# Patient Record
Sex: Male | Born: 1990 | Marital: Married | Smoking: Former smoker
Health system: Southern US, Community
[De-identification: ages and names within clinical notes are randomized; demographics above are authoritative.]

## PROBLEM LIST (undated history)

## (undated) DIAGNOSIS — R51 Headache: Secondary | ICD-10-CM

## (undated) DIAGNOSIS — G43909 Migraine, unspecified, not intractable, without status migrainosus: Secondary | ICD-10-CM

## (undated) DIAGNOSIS — R519 Headache, unspecified: Secondary | ICD-10-CM

## (undated) DIAGNOSIS — R011 Cardiac murmur, unspecified: Secondary | ICD-10-CM

## (undated) DIAGNOSIS — I10 Essential (primary) hypertension: Secondary | ICD-10-CM

## (undated) HISTORY — DX: Migraine, unspecified, not intractable, without status migrainosus: G43.909

## (undated) HISTORY — PX: HIP SURGERY: SHX245

## (undated) HISTORY — DX: Headache: R51

## (undated) HISTORY — DX: Headache, unspecified: R51.9

## (undated) HISTORY — DX: Cardiac murmur, unspecified: R01.1

## (undated) HISTORY — DX: Essential (primary) hypertension: I10

---

## 2018-05-12 ENCOUNTER — Ambulatory Visit (INDEPENDENT_AMBULATORY_CARE_PROVIDER_SITE_OTHER): Payer: Managed Care, Other (non HMO) | Admitting: Family Medicine

## 2018-05-12 ENCOUNTER — Encounter: Payer: Self-pay | Admitting: Family Medicine

## 2018-05-12 ENCOUNTER — Ambulatory Visit (INDEPENDENT_AMBULATORY_CARE_PROVIDER_SITE_OTHER): Payer: Managed Care, Other (non HMO)

## 2018-05-12 VITALS — BP 138/90 | HR 92 | Temp 97.2°F | Ht 71.0 in | Wt 232.0 lb

## 2018-05-12 DIAGNOSIS — R5383 Other fatigue: Secondary | ICD-10-CM

## 2018-05-12 DIAGNOSIS — M25551 Pain in right hip: Secondary | ICD-10-CM

## 2018-05-12 DIAGNOSIS — M25552 Pain in left hip: Secondary | ICD-10-CM | POA: Diagnosis not present

## 2018-05-12 DIAGNOSIS — F17210 Nicotine dependence, cigarettes, uncomplicated: Secondary | ICD-10-CM

## 2018-05-12 DIAGNOSIS — G8929 Other chronic pain: Secondary | ICD-10-CM | POA: Diagnosis not present

## 2018-05-12 DIAGNOSIS — M5441 Lumbago with sciatica, right side: Secondary | ICD-10-CM | POA: Diagnosis not present

## 2018-05-12 DIAGNOSIS — Z23 Encounter for immunization: Secondary | ICD-10-CM

## 2018-05-12 DIAGNOSIS — M93003 Unspecified slipped upper femoral epiphysis (nontraumatic), unspecified hip: Secondary | ICD-10-CM

## 2018-05-12 MED ORDER — GABAPENTIN 100 MG PO CAPS: 100.0000 mg | ORAL_CAPSULE | Freq: Two times a day (BID) | ORAL | 2 refills | Status: DC

## 2018-05-12 NOTE — Progress Notes (Signed)
Subjective:    Patient ID: Colin Calhoun, male    DOB: Aug 20, 1990, 27 y.o.   MRN: [id number]  HPI   Patient presents to clinic to establish care with new PCP.  Past medical history, social history, surgical history and family history reviewed and updated in chart.  Patient's main concern today is low back pain and bilateral hip pain.  Patient states he has struggled with back pain for the past 7 to 8 years.  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  States he was told by orthopedics that at age 44 he may have to have another surgery, especially on his right hip, but also was told this was not a requirement.  Patient was not having any issues at the time so he decided not to follow back up with orthopedics.  As he has gotten older, low back pain and hip pains have increased.  He does not do any heavy lifting for work, although lifting he does now is picking up his 2 young children.  States he does notice some pain/pressure relief when he lays flat on back and raise his leg straight up in the air, but when he raises right leg up in the air it is not fully go up straight it will hang out towards the right.  He smokes 1/2 PPD x12 years.    Patient Active Problem List   Diagnosis Date Noted  . Chronic bilateral low back pain with right-sided sciatica 05/12/2018  . Cigarette smoker 05/12/2018  . Fatigue 05/12/2018  . Pain of both hip joints 05/12/2018   Past Medical History:  Diagnosis Date  . Frequent headaches   . Heart murmur   . Hypertension   . Migraines    Social History   Tobacco Use  . Smoking status: Current Every Day Smoker    Packs/day: 0.50    Years: 12.00    Pack years: 6.00  . Smokeless tobacco: Never Used  Substance Use Topics  . Alcohol use: Yes    Frequency: Never    Comment: special occasion  2 drinks a year   Past Surgical History:  Procedure Laterality Date  . HIP SURGERY Bilateral    Age 76   Review of Systems  Constitutional: +fatigue. Negative for chills, and fever.  HENT: Negative for congestion, ear pain, sinus pain and sore throat.   Eyes: Negative.   Respiratory: Negative for cough, shortness of breath and wheezing.   Cardiovascular: Negative for chest pain, palpitations and leg swelling.  Gastrointestinal: Negative for abdominal pain, diarrhea, nausea and vomiting.  Genitourinary: Negative for dysuria, frequency and urgency.  Musculoskeletal: low back pain, bilat hip pain Skin: Negative for color change, pallor and rash.  Neurological: Negative for syncope, light-headedness and headaches.  Psychiatric/Behavioral: The patient is not nervous/anxious.       Objective:   Physical Exam  Constitutional: He is oriented to person, place, and time. He appears well-developed. No distress.  HENT:  Head: Normocephalic and atraumatic.  Right Ear: External ear normal.  Left Ear: External ear normal.  Mouth/Throat: Oropharynx is clear and moist.  Eyes: Pupils are equal, round, and reactive to light. EOM are normal. Right eye exhibits no discharge. No scleral icterus.  Neck: Normal range of motion. Neck supple. No tracheal deviation present.  Cardiovascular: Normal rate and regular rhythm.  Pulmonary/Chest: Effort  normal and breath sounds normal. No respiratory distress.  Musculoskeletal: He exhibits tenderness. He exhibits no edema.  Tenderness along lumbar spine and bilateral paraspinal pain in bilateral hips, right more than left.  Pain in the right hip and low back when right leg brace.  Patient able to tolerate left leg raise without much pain. Quadricep strength normal.  Light touch sensation in lower extremities normal.  Neurological: He is alert and oriented to person, place, and time.  Skin: Skin is warm and dry. He is not diaphoretic. No pallor.  Psychiatric: He  has a normal mood and affect. His behavior is normal.  Nursing note and vitals reviewed.  Vitals:   05/12/18 1000  BP: 138/90  Pulse: 92  Temp: (!) 97.2 F (36.2 C)  SpO2: 97%      Assessment & Plan:    Low back pain-we will get x-ray of lumbar spine to further evaluate.  Patient will start low-dose gabapentin 100 mg twice daily to see if this helps calm pain.  Also advised he can take ibuprofen as needed for pain control.  Bilateral hip pain-we will get x-rays of both hips.  Patient advised I suspect that issues with his hips could be causing and or related to his low back pain.  Cigarette smoker- patient smokes half a pack per day, not ready to quit at this time.  Patient made aware that smoking not only affects vessels, organs, but it also affects bone strength.  Fatigue -- lab work ordered to further eval  Flu vaccine given  Pending results of x-rays and lab work will determine next step in plan of care.  Most likely will do orthopedic referral.   Patient will follow back up here in 3 months for recheck on how gabapentin is helping to control pain. He is aware he can return to clinic sooner if issues arise.

## 2018-05-13 NOTE — Progress Notes (Unsigned)
Orthpedic surgery referral placed.

## 2018-05-15 ENCOUNTER — Other Ambulatory Visit: Payer: Managed Care, Other (non HMO)

## 2018-06-02 ENCOUNTER — Emergency Department: Payer: Managed Care, Other (non HMO)

## 2018-06-02 ENCOUNTER — Emergency Department
Admission: EM | Admit: 2018-06-02 | Discharge: 2018-06-02 | Disposition: A | Discharge status: Home / Self Care | Payer: Managed Care, Other (non HMO) | Attending: Emergency Medicine | Admitting: Emergency Medicine

## 2018-06-02 ENCOUNTER — Encounter: Payer: Self-pay | Admitting: Emergency Medicine

## 2018-06-02 DIAGNOSIS — R1084 Generalized abdominal pain: Secondary | ICD-10-CM | POA: Diagnosis present

## 2018-06-02 DIAGNOSIS — I10 Essential (primary) hypertension: Secondary | ICD-10-CM | POA: Insufficient documentation

## 2018-06-02 DIAGNOSIS — F17200 Nicotine dependence, unspecified, uncomplicated: Secondary | ICD-10-CM | POA: Diagnosis not present

## 2018-06-02 DIAGNOSIS — Z79899 Other long term (current) drug therapy: Secondary | ICD-10-CM | POA: Insufficient documentation

## 2018-06-02 DIAGNOSIS — R52 Pain, unspecified: Secondary | ICD-10-CM

## 2018-06-02 LAB — HEPATIC FUNCTION PANEL
ALBUMIN: 4.6 g/dL (ref 3.5–5.0)
ALT: 35 U/L (ref 0–44)
AST: 26 U/L (ref 15–41)
Alkaline Phosphatase: 49 U/L (ref 38–126)
Bilirubin, Direct: <0.1 mg/dL (ref 0.0–0.2)
TOTAL PROTEIN: 7.9 g/dL (ref 6.5–8.1)
Total Bilirubin: 0.7 mg/dL (ref 0.3–1.2)

## 2018-06-02 LAB — CBC
HEMATOCRIT: 48.9 % (ref 39.0–52.0)
Hemoglobin: 17 g/dL (ref 13.0–17.0)
MCH: 31.8 pg (ref 26.0–34.0)
MCHC: 34.8 g/dL (ref 30.0–36.0)
MCV: 91.4 fL (ref 80.0–100.0)
NRBC: 0 % (ref 0.0–0.2)
PLATELETS: 365 10*3/uL (ref 150–400)
RBC: 5.35 MIL/uL (ref 4.22–5.81)
RDW: 11.9 % (ref 11.5–15.5)
WBC: 8 10*3/uL (ref 4.0–10.5)

## 2018-06-02 LAB — BASIC METABOLIC PANEL
Anion gap: 8 (ref 5–15)
BUN: 8 mg/dL (ref 6–20)
CALCIUM: 9.3 mg/dL (ref 8.9–10.3)
CO2: 25 mmol/L (ref 22–32)
CREATININE: 0.86 mg/dL (ref 0.61–1.24)
Chloride: 104 mmol/L (ref 98–111)
GFR calc non Af Amer: >60 mL/min (ref >60)
Glucose, Bld: 114 mg/dL — ABNORMAL HIGH (ref 70–99)
Potassium: 3.6 mmol/L (ref 3.5–5.1)
SODIUM: 137 mmol/L (ref 135–145)

## 2018-06-02 LAB — TROPONIN I: Troponin I: <0.03 ng/mL (ref <0.03)

## 2018-06-02 NOTE — ED Triage Notes (Signed)
Pt reports sharp pain to upper left chest that radiates into his abd since last pm. Pt states pain is intermittent. Pt denies SOB, dizziness or other sx's.

## 2018-06-02 NOTE — ED Notes (Signed)
Pt ambulatory to POV without difficulty. VSS. NAD. Discharge instructions, RX and follow up reviewed. All questions and concerns addressed.  

## 2018-06-02 NOTE — ED Triage Notes (Signed)
Pt concerned about appendicitis because what he has read online points to the possibility of appendicitis.

## 2018-06-02 NOTE — ED Notes (Signed)
EDP at bedside at this time.  

## 2018-06-02 NOTE — Discharge Instructions (Addendum)
Please return for worse pain, fever, vomiting or if the pain continues in 2 or 3 days or see your doctor.

## 2018-06-02 NOTE — ED Provider Notes (Signed)
[facility] Emergency Department Provider Note   ____________________________________________   First MD Initiated Contact with Patient 06/02/18 1314     (approximate)  I have reviewed the triage vital signs and the nursing notes.   HISTORY  Chief Complaint Chest Pain   HPI Calix Heinbaugh is a 27 y.o. male patient reports to me he has had pain in the left upper abdomen and lower chest that then radiates down toward his umbilicus and his whole time he then cramps.  Is been going on for 2 or 3 days and this morning it was fairly severe and constant.  Since then it is improved he is only had a 3 times since she is been waiting here for 3-3/4 hours.  He is not short of breath he does not have a cough is no pain with deep breathing he has no other complaints.  He does not have any nausea vomiting or diarrhea.  Nothing seems to bring the pain on or make it worse.  The pain is sharp at times cramping other times.  3 episodes he has had here in the emergency room were brief.  Lasting just a minute or so.   Past Medical History:  Diagnosis Date  . Frequent headaches   . Heart murmur   . Hypertension   . Migraines     Patient Active Problem List   Diagnosis Date Noted  . Chronic bilateral low back pain with right-sided sciatica 05/12/2018  . Cigarette smoker 05/12/2018  . Fatigue 05/12/2018  . Pain of both hip joints 05/12/2018    Past Surgical History:  Procedure Laterality Date  . HIP SURGERY Bilateral    Age 83    Prior to Admission medications   Medication Sig Start Date End Date Taking? Authorizing Provider  gabapentin (NEURONTIN) 100 MG capsule Take 1 capsule (100 mg total) by mouth 2 (two) times daily. 05/12/18   [name], FNP    Allergies Patient has no allergy information on record.  Family History  Problem Relation Age of Onset  . Diabetes Father   . Glaucoma Father   . Autism Sister   . Cancer Maternal Grandmother   . Depression  Paternal Grandfather     Social History Social History   Tobacco Use  . Smoking status: Current Every Day Smoker    Packs/day: 0.50    Years: 12.00    Pack years: 6.00  . Smokeless tobacco: Never Used  Substance Use Topics  . Alcohol use: Yes    Frequency: Never    Comment: special occasion 2 drinks a year  . Drug use: Never    Review of Systems  Constitutional: No fever/chills Eyes: No visual changes. ENT: No sore throat. Cardiovascular: Denies chest pain. Respiratory: Denies shortness of breath. Gastrointestinal:  abdominal pain.  No nausea, no vomiting.  No diarrhea.  No constipation. Genitourinary: Negative for dysuria. Musculoskeletal: Negative for back pain. Skin: Negative for rash. Neurological: Negative for headaches, focal weakness   ____________________________________________   PHYSICAL EXAM:  VITAL SIGNS: ED Triage Vitals  Enc Vitals Group     BP 06/02/18 0940 (!) 162/100     Pulse Rate 06/02/18 0940 81     Resp 06/02/18 0940 20     Temp 06/02/18 0940 97.9 F (36.6 C)     Temp Source 06/02/18 0940 Oral     SpO2 06/02/18 0940 100 %     Weight 06/02/18 0938 220 lb (99.8 kg)     Height  06/02/18 0938 5\' 10"  (1.778 m)     Head Circumference --      Peak Flow --      Pain Score 06/02/18 0938 8     Pain Loc --      Pain Edu? --      Excl. in GC? --     Constitutional: Alert and oriented. Well appearing and in no acute distress. Eyes: Conjunctivae are normal Head: Atraumatic. Nose: No congestion/rhinnorhea. Mouth/Throat: Mucous membranes are moist.  Oropharynx non-erythematous. Neck: No stridor.   Cardiovascular: Normal rate, regular rhythm. Grossly normal heart sounds.  Good peripheral circulation. Respiratory: Normal respiratory effort.  No retractions. Lungs CTAB. Gastrointestinal: Soft and nontender. No distention. No abdominal bruits. No CVA tenderness. Musculoskeletal: No lower extremity tenderness  Neurologic:  Normal speech and language.  No gross focal neurologic deficits are appreciated.  Skin:  Skin is warm, dry and intact. No rash noted. Psychiatric: Mood and affect are normal. Speech and behavior are normal.  ____________________________________________   LABS (all labs ordered are listed, but only abnormal results are displayed)  Labs Reviewed  BASIC METABOLIC PANEL - Abnormal; Notable for the following components:      Result Value   Glucose, Bld 114 (*)    All other components within normal limits  CBC  TROPONIN I  HEPATIC FUNCTION PANEL   ____________________________________________  EKG  EKG read interpreted by me shows normal sinus rhythm at a rate of 81 normal axis no acute ST-T wave changes ____________________________________________  RADIOLOGY  ED MD interpretation: Chest x-ray read by radiology reviewed by me does show a triangular density at the right base could be scarring.  Official radiology report(s): Dg Chest 2 View  Result Date: 06/02/2018 CLINICAL DATA:  Left chest pain.  Smoker, hypertension EXAM: CHEST - 2 VIEW COMPARISON:  None. FINDINGS: Heart is normal size. Increased density noted at the right lung base, favor scarring. No effusions. Left lung is clear. Mild peribronchial thickening. No acute bony abnormality. IMPRESSION: Triangular density noted at the right lung base, favor scarring. Mild peribronchial thickening, favor related to smoking/chronic bronchitis. Electronically Signed   By: Charlett Nose M.D.   On: 06/02/2018 10:07   Dg Abd 2 Views  Result Date: 06/02/2018 CLINICAL DATA:  Left upper quadrant pain for 2 days EXAM: ABDOMEN - 2 VIEW COMPARISON:  05/12/2018 FINDINGS: Scattered large and small bowel gas is noted. No free air is seen. Prominent Riedel's lobe of the liver is noted. No abnormal mass or abnormal calcifications are seen. Postsurgical changes in the hips are noted bilaterally. No other focal abnormality is noted. IMPRESSION: No acute abnormality noted.  Electronically Signed   By: Alcide Clever M.D.   On: 06/02/2018 13:54    ____________________________________________   PROCEDURES  Procedure(s) performed:   Procedures  Critical Care performed:   ____________________________________________   INITIAL IMPRESSION / ASSESSMENT AND PLAN / ED COURSE Patient's pain is better.  Lab work and x-rays are good I will let him go home he can return if worse follow-up with his doctor          ____________________________________________   FINAL CLINICAL IMPRESSION(S) / ED DIAGNOSES  Final diagnoses:  Pain  Generalized abdominal cramps     ED Discharge Orders    None       Note:  This document was prepared using Dragon voice recognition software and may include unintentional dictation errors.    Arnaldo Natal, MD 06/02/18 [phone]

## 2018-06-02 NOTE — ED Notes (Signed)
Pt c/o LUQ abd pain that radiates into RLQ. Pt states pain intermittent since last night. Pt denies N/V/D, denies urinary symptoms, denies CP or SOB. Pt states he is concerned about appendicitis due to google and his mother. PT A&O x4. NAD noted at this time, this RN isntructed patient to stop drinking Pepsi until evaluated by MD.

## 2018-08-18 ENCOUNTER — Ambulatory Visit: Payer: Managed Care, Other (non HMO) | Admitting: Family Medicine

## 2018-08-18 DIAGNOSIS — Z0289 Encounter for other administrative examinations: Secondary | ICD-10-CM

## 2018-08-18 NOTE — Progress Notes (Deleted)
   Subjective:    Patient ID: Colin RobsonWilliam Calhoun, male    DOB: 11/30/1990, 27 y.o.   MRN: [id number]  HPI  Presents to clinic for follow up on back pain  Patient Active Problem List   Diagnosis Date Noted  . Chronic bilateral low back pain with right-sided sciatica 05/12/2018  . Cigarette smoker 05/12/2018  . Fatigue 05/12/2018  . Pain of both hip joints 05/12/2018   Social History   Tobacco Use  . Smoking status: Current Every Day Smoker    Packs/day: 0.50    Years: 12.00    Pack years: 6.00  . Smokeless tobacco: Never Used  Substance Use Topics  . Alcohol use: Yes    Frequency: Never    Comment: special occasion 2 drinks a year   Review of Systems   Constitutional: Negative for chills, fatigue and fever.  HENT: Negative for congestion, ear pain, sinus pain and sore throat.   Eyes: Negative.   Respiratory: Negative for cough, shortness of breath and wheezing.   Cardiovascular: Negative for chest pain, palpitations and leg swelling.  Gastrointestinal: Negative for abdominal pain, diarrhea, nausea and vomiting.  Genitourinary: Negative for dysuria, frequency and urgency.  Musculoskeletal: Negative for arthralgias and myalgias.  Skin: Negative for color change, pallor and rash.  Neurological: Negative for syncope, light-headedness and headaches.  Psychiatric/Behavioral: The patient is not nervous/anxious.       Objective:   Physical Exam        Assessment & Plan:

## 2018-09-01 MED ORDER — LACTATED RINGERS IV SOLN
INTRAVENOUS | Status: DC
Start: ? — End: 2018-09-01

## 2018-09-01 MED ORDER — BISACODYL 10 MG RE SUPP
10.00 | RECTAL | Status: DC
Start: ? — End: 2018-09-01

## 2018-09-01 MED ORDER — SENNOSIDES-DOCUSATE SODIUM 8.6-50 MG PO TABS
1.00 | ORAL_TABLET | ORAL | Status: DC
Start: 2018-09-01 — End: 2018-09-01

## 2018-09-01 MED ORDER — HYDROMORPHONE HCL 1 MG/ML IJ SOLN
.50 | INTRAMUSCULAR | Status: DC
Start: ? — End: 2018-09-01

## 2018-09-01 MED ORDER — POLYETHYLENE GLYCOL 3350 17 G PO PACK
17.00 | PACK | ORAL | Status: DC
Start: 2018-09-02 — End: 2018-09-01

## 2018-09-01 MED ORDER — ACETAMINOPHEN 325 MG PO TABS
975.00 | ORAL_TABLET | ORAL | Status: DC
Start: 2018-09-01 — End: 2018-09-01

## 2018-09-01 MED ORDER — ASPIRIN 325 MG PO TABS
325.00 | ORAL_TABLET | ORAL | Status: DC
Start: 2018-09-01 — End: 2018-09-01

## 2018-09-01 MED ORDER — OXYCODONE HCL 5 MG PO TABS
5.00 | ORAL_TABLET | ORAL | Status: DC
Start: ? — End: 2018-09-01

## 2019-03-05 MED ORDER — BISACODYL 10 MG RE SUPP
10.00 | RECTAL | Status: DC
Start: ? — End: 2019-03-05

## 2019-03-05 MED ORDER — POLYETHYLENE GLYCOL 3350 17 G PO PACK
17.00 | PACK | ORAL | Status: DC
Start: 2019-03-06 — End: 2019-03-05

## 2019-03-05 MED ORDER — LACTATED RINGERS IV SOLN
INTRAVENOUS | Status: DC
Start: ? — End: 2019-03-05

## 2019-03-05 MED ORDER — KETOROLAC TROMETHAMINE 30 MG/ML IJ SOLN
30.00 | INTRAMUSCULAR | Status: DC
Start: ? — End: 2019-03-05

## 2019-03-05 MED ORDER — SENNOSIDES-DOCUSATE SODIUM 8.6-50 MG PO TABS
1.00 | ORAL_TABLET | ORAL | Status: DC
Start: 2019-03-05 — End: 2019-03-05

## 2019-03-05 MED ORDER — HYDROMORPHONE HCL 1 MG/ML IJ SOLN
0.50 | INTRAMUSCULAR | Status: DC
Start: ? — End: 2019-03-05

## 2019-03-05 MED ORDER — ASPIRIN EC 325 MG PO TBEC
325.00 | DELAYED_RELEASE_TABLET | ORAL | Status: DC
Start: 2019-03-05 — End: 2019-03-05

## 2019-03-05 MED ORDER — ACETAMINOPHEN 325 MG PO TABS
650.00 | ORAL_TABLET | ORAL | Status: DC
Start: ? — End: 2019-03-05

## 2019-03-05 MED ORDER — SODIUM CHLORIDE 0.9 % IV SOLN
INTRAVENOUS | Status: DC
Start: ? — End: 2019-03-05

## 2019-03-05 MED ORDER — OXYCODONE HCL 5 MG PO TABS
5.00 | ORAL_TABLET | ORAL | Status: DC
Start: ? — End: 2019-03-05

## 2019-03-05 MED ORDER — GENERIC EXTERNAL MEDICATION
Status: DC
Start: ? — End: 2019-03-05

## 2020-04-03 ENCOUNTER — Other Ambulatory Visit: Payer: Self-pay

## 2020-04-03 ENCOUNTER — Ambulatory Visit
Admission: RE | Admit: 2020-04-03 | Discharge: 2020-04-03 | Disposition: A | Discharge status: Home / Self Care | Payer: Managed Care, Other (non HMO) | Source: Ambulatory Visit | Attending: Emergency Medicine | Admitting: Emergency Medicine

## 2020-04-03 ENCOUNTER — Telehealth: Payer: Self-pay | Admitting: Family Medicine

## 2020-04-03 VITALS — BP 138/84 | HR 63 | Temp 98.2°F | Resp 17

## 2020-04-03 DIAGNOSIS — J069 Acute upper respiratory infection, unspecified: Secondary | ICD-10-CM | POA: Diagnosis not present

## 2020-04-03 NOTE — Discharge Instructions (Signed)
Take ibuprofen as needed for discomfort and Mucinex as needed for congestion.    Follow up with your primary care provider if your symptoms are not improving.

## 2020-04-03 NOTE — Telephone Encounter (Signed)
Opened in error

## 2020-04-03 NOTE — ED Triage Notes (Signed)
Patient reports he has had nasal congestion x3 days. Reports he took a covid test yesterday at [facility], which came back negative.

## 2020-04-03 NOTE — ED Provider Notes (Signed)
Colin Calhoun    CSN: [id number] Arrival date & time: 04/03/20  1450      History   Chief Complaint Chief Complaint  Patient presents with  . Nasal Congestion    HPI Colin Calhoun is a 29 y.o. male.   Patient presents with nasal congestion x3 days.  He states he had a sore throat last week but this resolved.  He states he does not feel unwell just has the sinus congestion.  He had a COVID test done yesterday at [facility] which resulted negative this morning.  He denies fever, chills, ear pain, current sore throat, cough, shortness of breath, abdominal pain, vomiting, diarrhea, rash, or other symptoms.  Treatment attempted at home with NyQuil and DayQuil.  The history is provided by the patient.    Past Medical History:  Diagnosis Date  . Frequent headaches   . Heart murmur   . Hypertension   . Migraines     Patient Active Problem List   Diagnosis Date Noted  . Chronic bilateral low back pain with right-sided sciatica 05/12/2018  . Cigarette smoker 05/12/2018  . Fatigue 05/12/2018  . Pain of both hip joints 05/12/2018    Past Surgical History:  Procedure Laterality Date  . HIP SURGERY Bilateral    Age 89       Home Medications    Prior to Admission medications   Medication Sig Start Date End Date Taking? Authorizing Provider  gabapentin (NEURONTIN) 100 MG capsule Take 1 capsule (100 mg total) by mouth 2 (two) times daily. 05/12/18   [name], FNP    Family History Family History  Problem Relation Age of Onset  . Diabetes Father   . Glaucoma Father   . Autism Sister   . Cancer Maternal Grandmother   . Depression Paternal Grandfather     Social History Social History   Tobacco Use  . Smoking status: Former Smoker    Packs/day: 0.50    Years: 12.00    Pack years: 6.00    Quit date: 03/31/2020  . Smokeless tobacco: Never Used  Vaping Use  . Vaping Use: Never used  Substance Use Topics  . Alcohol use: Yes    Comment: special occasion 2  drinks a year  . Drug use: Never     Allergies   Patient has no known allergies.   Review of Systems Review of Systems  Constitutional: Negative for chills and fever.  HENT: Positive for congestion. Negative for ear pain and sore throat.   Eyes: Negative for pain and visual disturbance.  Respiratory: Negative for cough and shortness of breath.   Cardiovascular: Negative for chest pain and palpitations.  Gastrointestinal: Negative for abdominal pain, diarrhea and vomiting.  Genitourinary: Negative for dysuria and hematuria.  Musculoskeletal: Negative for arthralgias and back pain.  Skin: Negative for color change and rash.  Neurological: Negative for seizures and syncope.  All other systems reviewed and are negative.    Physical Exam Triage Vital Signs ED Triage Vitals  Enc Vitals Group     BP      Pulse      Resp      Temp      Temp src      SpO2      Weight      Height      Head Circumference      Peak Flow      Pain Score      Pain Loc  Pain Edu?      Excl. in GC?    No data found.  Updated Vital Signs BP 138/84   Pulse 63   Temp 98.2 F (36.8 C)   Resp 17   SpO2 97%   Visual Acuity Right Eye Distance:   Left Eye Distance:   Bilateral Distance:    Right Eye Near:   Left Eye Near:    Bilateral Near:     Physical Exam Vitals and nursing note reviewed.  Constitutional:      Appearance: He is well-developed.  HENT:     Head: Normocephalic and atraumatic.     Right Ear: Tympanic membrane normal.     Left Ear: Tympanic membrane normal.     Nose: Nose normal.     Mouth/Throat:     Mouth: Mucous membranes are moist.     Pharynx: Oropharynx is clear.  Eyes:     Conjunctiva/sclera: Conjunctivae normal.  Cardiovascular:     Rate and Rhythm: Normal rate and regular rhythm.     Heart sounds: No murmur heard.   Pulmonary:     Effort: Pulmonary effort is normal. No respiratory distress.     Breath sounds: Normal breath sounds.  Abdominal:      Palpations: Abdomen is soft.     Tenderness: There is no abdominal tenderness. There is no guarding or rebound.  Musculoskeletal:     Cervical back: Neck supple.  Skin:    General: Skin is warm and dry.     Findings: No rash.  Neurological:     General: No focal deficit present.     Mental Status: He is alert and oriented to person, place, and time.     Gait: Gait normal.  Psychiatric:        Mood and Affect: Mood normal.        Behavior: Behavior normal.      UC Treatments / Results  Labs (all labs ordered are listed, but only abnormal results are displayed) Labs Reviewed - No data to display  EKG   Radiology No results found.  Procedures Procedures (including critical care time)  Medications Ordered in UC Medications - No data to display  Initial Impression / Assessment and Plan / UC Course  I have reviewed the triage vital signs and the nursing notes.  Pertinent labs & imaging results that were available during my care of the patient were reviewed by me and considered in my medical decision making (see chart for details).    Viral URI.  Patient had a negative COVID test yesterday.  Discussed symptomatic treatment with ibuprofen and Mucinex.  Instructed him to follow-up with his PCP if his symptoms or not improving.  Patient agrees to plan of care.      Final Clinical Impressions(s) / UC Diagnoses   Final diagnoses:  Viral URI     Discharge Instructions     Take ibuprofen as needed for discomfort and Mucinex as needed for congestion.    Follow up with your primary care provider if your symptoms are not improving.       ED Prescriptions    None     I have reviewed the PDMP during this encounter.   Mickie Bail, NP 04/03/20 1513

## 2020-05-17 ENCOUNTER — Ambulatory Visit: Payer: Managed Care, Other (non HMO) | Admitting: Family Medicine

## 2020-06-26 ENCOUNTER — Ambulatory Visit: Payer: Managed Care, Other (non HMO) | Admitting: Family Medicine

## 2020-08-17 IMAGING — DX DG LUMBAR SPINE COMPLETE 4+V
5 series · 5 of 5 positions shown · non-contrast
Comparison: No recent prior.

CLINICAL DATA: Low back pain that radiates down both hips.

EXAM:
LUMBAR SPINE - COMPLETE 4+ VIEW

[lumbar spine ap]
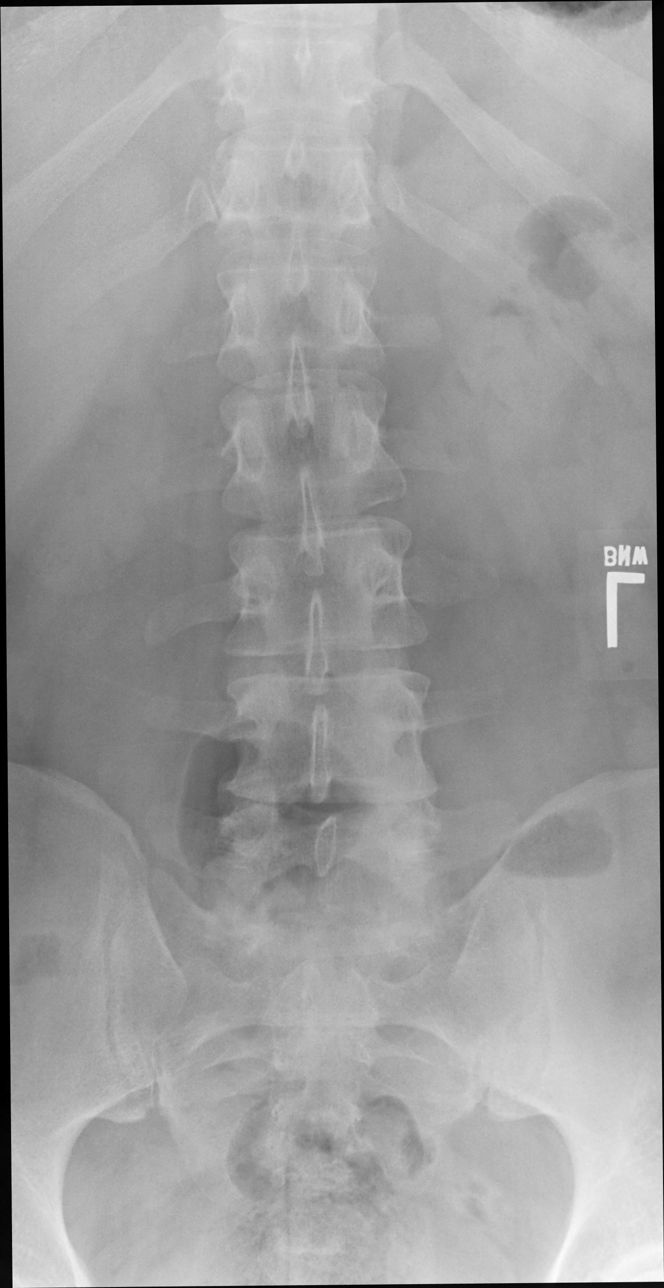

[lumbar spine obl (oblique) (1 of 2)]
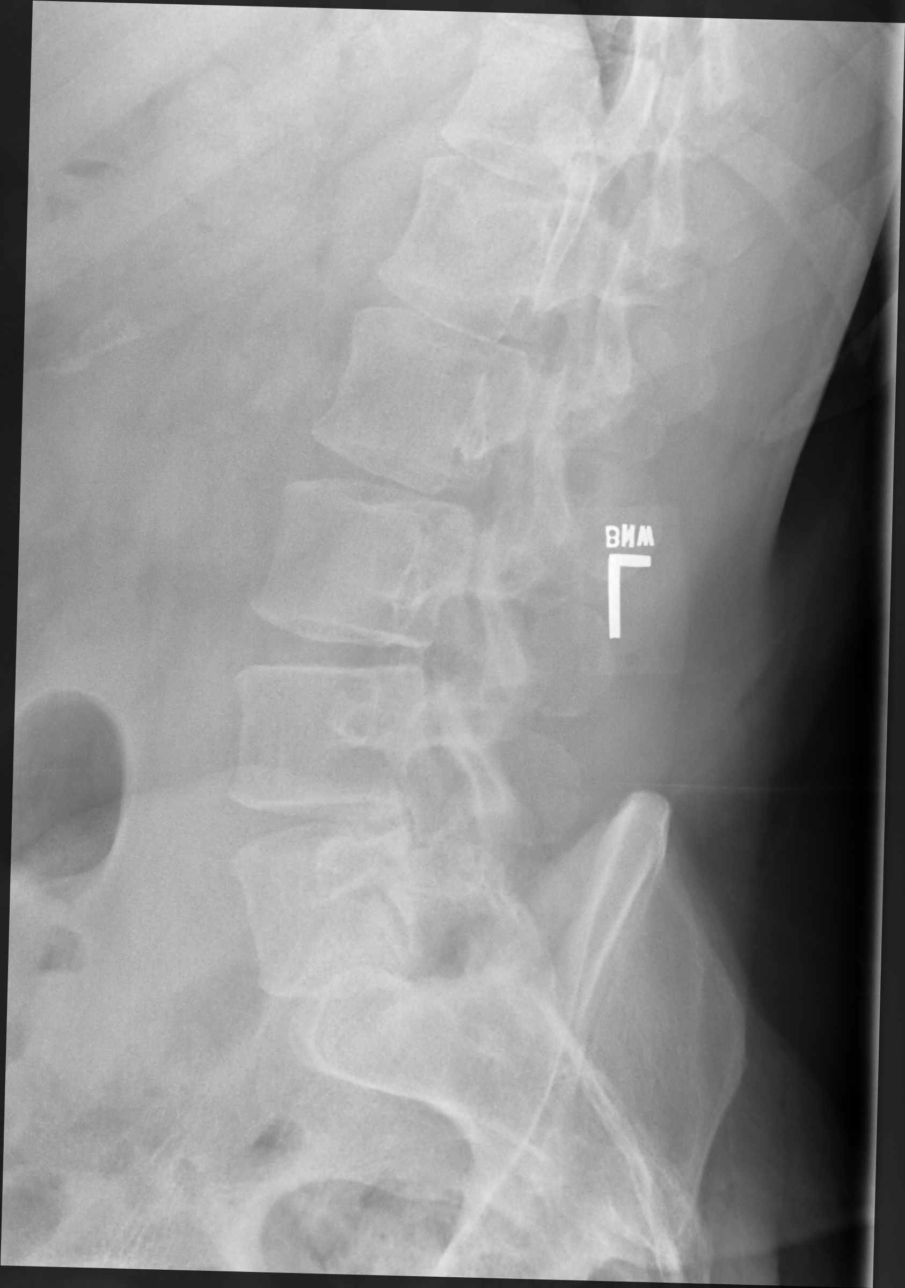

[lumbar spine obl (oblique) (2 of 2)]
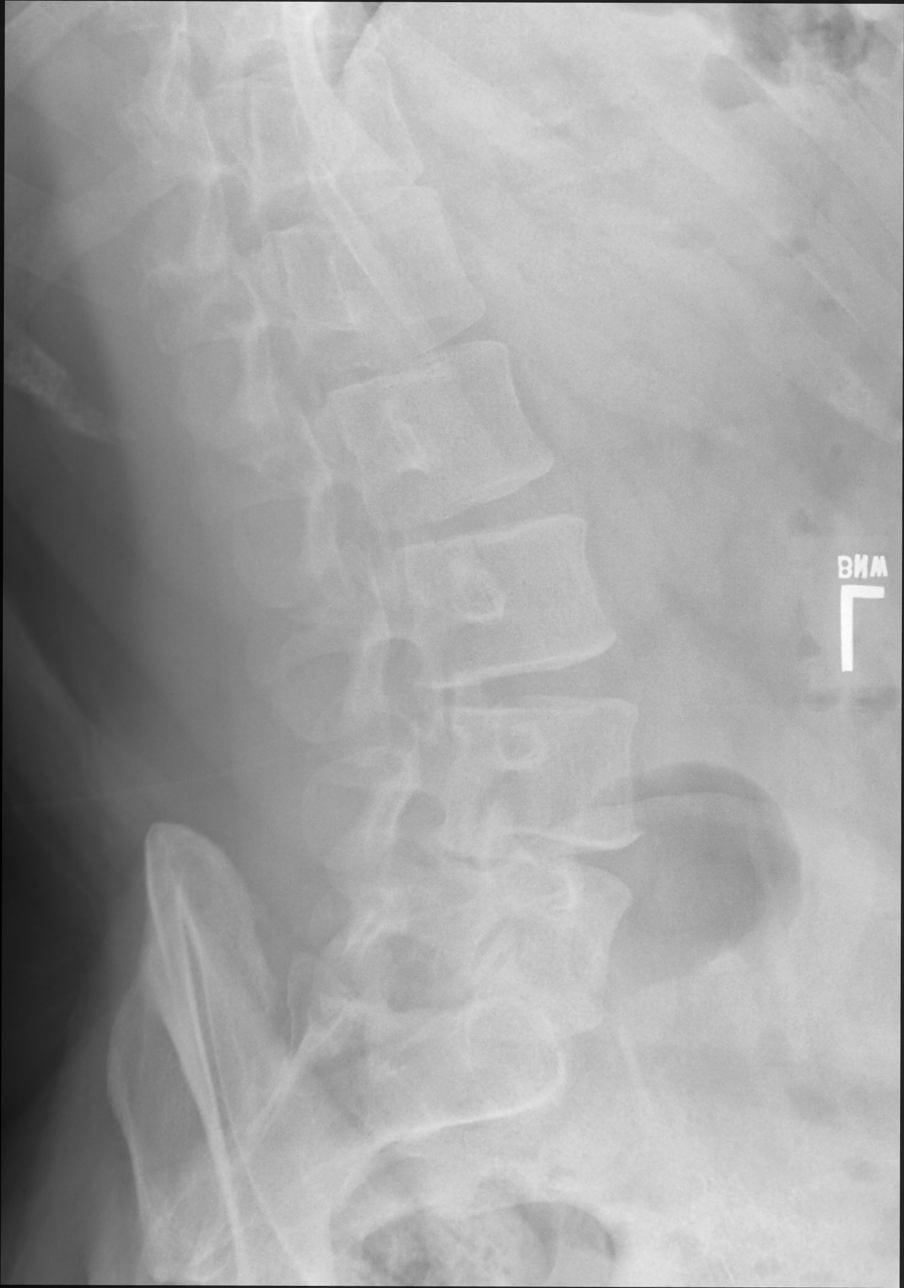

[lumbar spine lat]
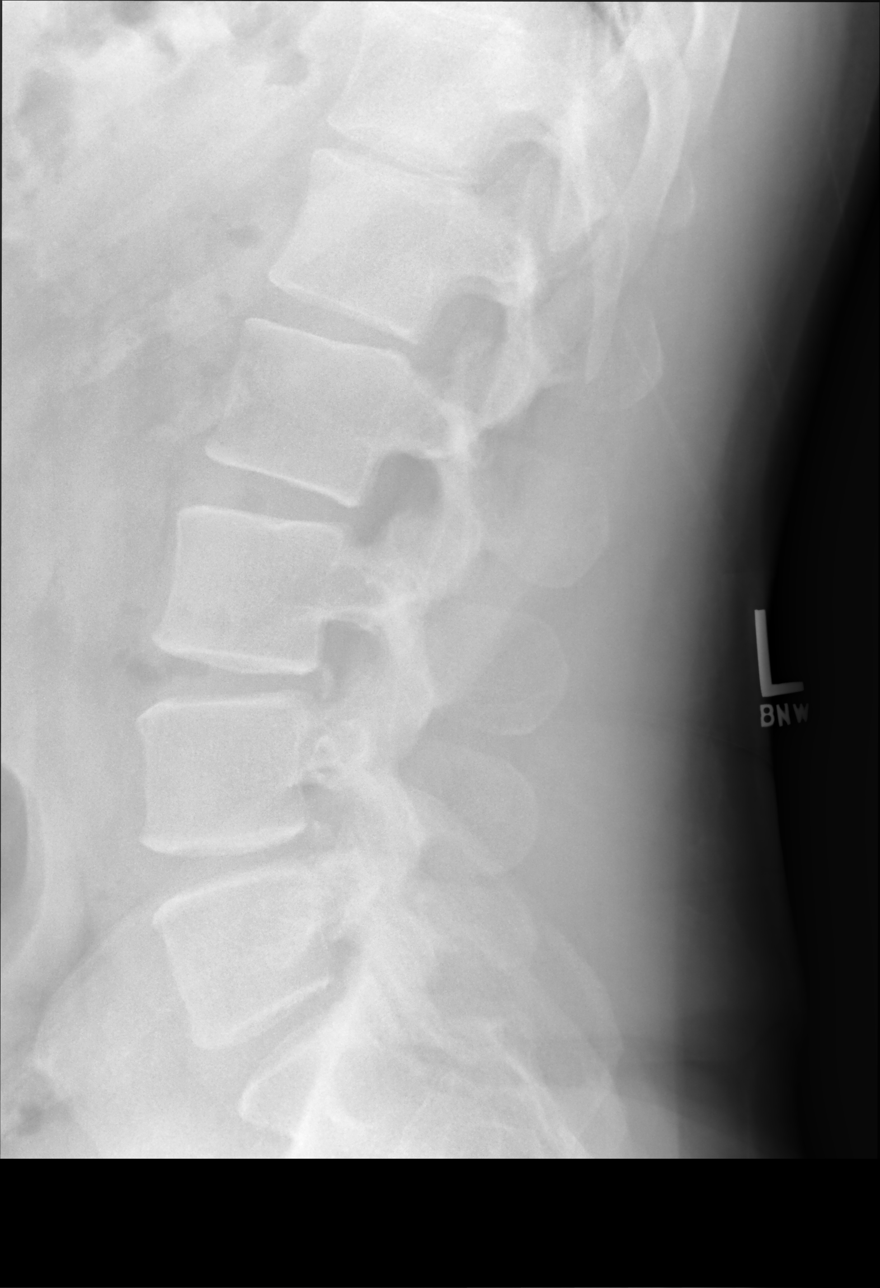

[lumbar spot lat]
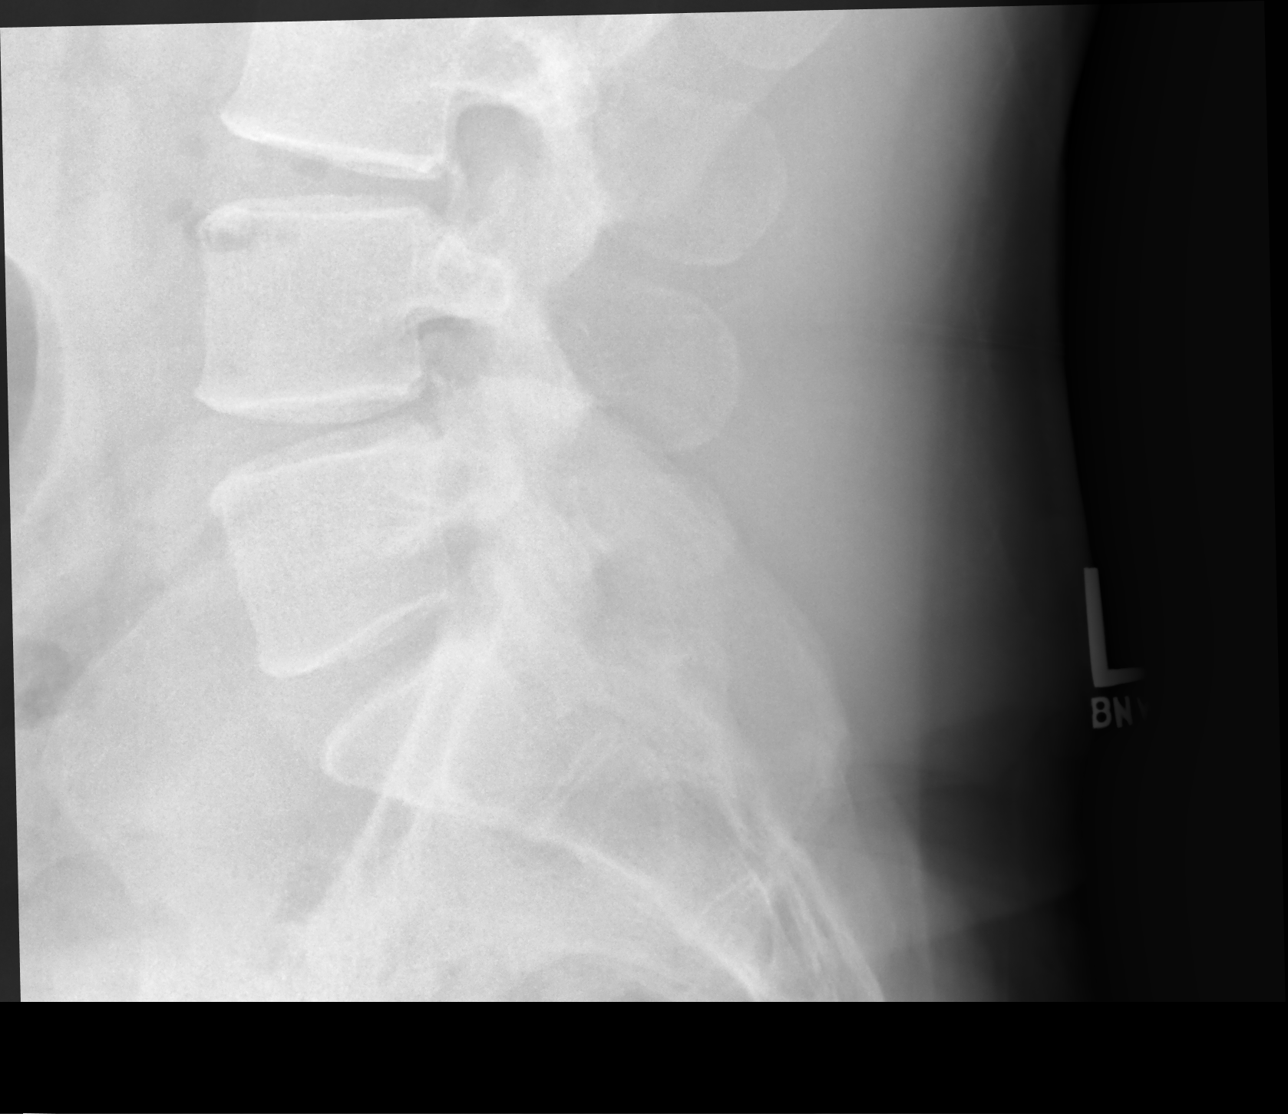

[5 of 5 positions shown; findings below may reference images not displayed]

FINDINGS: No acute bony abnormality identified. No evidence of fracture.
Normal alignment and mineralization.
IMPRESSION: No acute or focal abnormality.

## 2021-03-21 ENCOUNTER — Ambulatory Visit: Payer: Self-pay

## 2021-03-21 ENCOUNTER — Ambulatory Visit
Admission: RE | Admit: 2021-03-21 | Discharge: 2021-03-21 | Disposition: A | Discharge status: Home / Self Care | Payer: Managed Care, Other (non HMO) | Source: Ambulatory Visit

## 2021-03-21 ENCOUNTER — Other Ambulatory Visit: Payer: Self-pay

## 2021-03-21 VITALS — BP 139/88 | HR 85 | Temp 98.4°F | Resp 18

## 2021-03-21 DIAGNOSIS — B349 Viral infection, unspecified: Secondary | ICD-10-CM

## 2021-03-21 NOTE — Discharge Instructions (Addendum)
Your COVID test is pending.  You should self quarantine until the test result is back.    Take Tylenol or ibuprofen as needed for fever or discomfort.  Rest and keep yourself hydrated.    Follow-up with your primary care provider if your symptoms are not improving.     

## 2021-03-21 NOTE — ED Triage Notes (Signed)
Patient presents to Urgent Care with complaints of cough, headache, and body aches.   Denies fever.

## 2021-03-21 NOTE — ED Provider Notes (Signed)
Colin Calhoun    CSN: [id number] Arrival date & time: 03/21/21  0932      History   Chief Complaint Chief Complaint  Patient presents with   Generalized Body Aches    X 1 day     HPI Colin Calhoun is a 30 y.o. male.  Patient presents with 1 day history of headache, body aches, mild nonproductive cough.  He denies fever, chills, rash, shortness of breath, or other symptoms.  Tylenol taken at home.  His medical history includes hypertension, migraines, heart murmur, chronic low back pain, fatigue.  The history is provided by the patient and medical records.   Past Medical History:  Diagnosis Date   Frequent headaches    Heart murmur    Hypertension    Migraines     Patient Active Problem List   Diagnosis Date Noted   Chronic bilateral low back pain with right-sided sciatica 05/12/2018   Cigarette smoker 05/12/2018   Fatigue 05/12/2018   Pain of both hip joints 05/12/2018    Past Surgical History:  Procedure Laterality Date   HIP SURGERY Bilateral    Age 51       Home Medications    Prior to Admission medications   Medication Sig Start Date End Date Taking? Authorizing Provider  aspirin 325 MG EC tablet Take by mouth. 03/05/19  Yes [provider]  amphetamine-dextroamphetamine (ADDERALL) 20 MG tablet Take 20 mg by mouth daily. 02/05/21   [provider]  clonazePAM (KLONOPIN) 1 MG tablet Take 0.5-1 mg by mouth daily as needed. 03/05/21   [provider]  gabapentin (NEURONTIN) 100 MG capsule Take 1 capsule (100 mg total) by mouth 2 (two) times daily. 05/12/18   [name], FNP  metFORMIN (GLUCOPHAGE) 500 MG tablet Take 500 mg by mouth 2 (two) times daily. 03/12/21   [provider]    Family History Family History  Problem Relation Age of Onset   Diabetes Father    Glaucoma Father    Autism Sister    Cancer Maternal Grandmother    Depression Paternal Grandfather     Social History Social History   Tobacco  Use   Smoking status: Former    Packs/day: 0.50    Years: 12.00    Pack years: 6.00    Types: Cigarettes    Quit date: 03/31/2020    Years since quitting: 0.9   Smokeless tobacco: Never  Vaping Use   Vaping Use: Never used  Substance Use Topics   Alcohol use: Yes    Comment: special occasion 2 drinks a year   Drug use: Never     Allergies   Patient has no known allergies.   Review of Systems Review of Systems  Constitutional:  Negative for chills and fever.  HENT:  Negative for ear pain and sore throat.   Respiratory:  Positive for cough. Negative for shortness of breath.   Cardiovascular:  Negative for chest pain and palpitations.  Gastrointestinal:  Negative for abdominal pain, diarrhea and vomiting.  Skin:  Negative for color change and rash.  Neurological:  Positive for headaches. Negative for dizziness and syncope.  All other systems reviewed and are negative.   Physical Exam Triage Vital Signs ED Triage Vitals  Enc Vitals Group     BP      Pulse      Resp      Temp      Temp src      SpO2  Weight      Height      Head Circumference      Peak Flow      Pain Score      Pain Loc      Pain Edu?      Excl. in GC?    No data found.  Updated Vital Signs BP 139/88 (BP Location: Left Arm)   Pulse 85   Temp 98.4 F (36.9 C) (Oral)   Resp 18   SpO2 97%   Visual Acuity Right Eye Distance:   Left Eye Distance:   Bilateral Distance:    Right Eye Near:   Left Eye Near:    Bilateral Near:     Physical Exam Vitals and nursing note reviewed.  Constitutional:      General: He is not in acute distress.    Appearance: He is well-developed.  HENT:     Head: Normocephalic and atraumatic.     Right Ear: Tympanic membrane normal.     Left Ear: Tympanic membrane normal.     Nose: Nose normal.     Mouth/Throat:     Mouth: Mucous membranes are moist.     Pharynx: Oropharynx is clear.  Eyes:     Conjunctiva/sclera: Conjunctivae normal.   Cardiovascular:     Rate and Rhythm: Normal rate and regular rhythm.     Heart sounds: Normal heart sounds.  Pulmonary:     Effort: Pulmonary effort is normal. No respiratory distress.     Breath sounds: Normal breath sounds.  Abdominal:     Palpations: Abdomen is soft.     Tenderness: There is no abdominal tenderness.  Musculoskeletal:     Cervical back: Neck supple.  Skin:    General: Skin is warm and dry.  Neurological:     General: No focal deficit present.     Mental Status: He is alert and oriented to person, place, and time.     Gait: Gait normal.  Psychiatric:        Mood and Affect: Mood normal.        Behavior: Behavior normal.     UC Treatments / Results  Labs (all labs ordered are listed, but only abnormal results are displayed) Labs Reviewed  NOVEL CORONAVIRUS, NAA    EKG   Radiology No results found.  Procedures Procedures (including critical care time)  Medications Ordered in UC Medications - No data to display  Initial Impression / Assessment and Plan / UC Course  I have reviewed the triage vital signs and the nursing notes.  Pertinent labs & imaging results that were available during my care of the patient were reviewed by me and considered in my medical decision making (see chart for details).   Viral illness.  COVID pending.  Instructed patient to self quarantine per CDC guidelines.  Discussed symptomatic treatment including Tylenol or ibuprofen, rest, hydration.  Instructed patient to follow up with PCP if symptoms are not improving.  Patient agrees to plan of care.    Final Clinical Impressions(s) / UC Diagnoses   Final diagnoses:  Viral illness     Discharge Instructions      Your COVID test is pending.  You should self quarantine until the test result is back.    Take Tylenol or ibuprofen as needed for fever or discomfort.  Rest and keep yourself hydrated.    Follow-up with your primary care provider if your symptoms are not  improving.  ED Prescriptions   None    PDMP not reviewed this encounter.   Mickie Bail, NP 03/21/21 1002

## 2021-03-22 LAB — SARS-COV-2, NAA 2 DAY TAT

## 2021-03-22 LAB — NOVEL CORONAVIRUS, NAA: SARS-CoV-2, NAA: DETECTED — AB

## 2021-09-21 DIAGNOSIS — R69 Illness, unspecified: Secondary | ICD-10-CM | POA: Diagnosis not present

## 2021-09-21 DIAGNOSIS — F419 Anxiety disorder, unspecified: Secondary | ICD-10-CM | POA: Diagnosis not present

## 2022-02-26 DIAGNOSIS — F419 Anxiety disorder, unspecified: Secondary | ICD-10-CM | POA: Diagnosis not present

## 2022-02-26 DIAGNOSIS — R69 Illness, unspecified: Secondary | ICD-10-CM | POA: Diagnosis not present

## 2022-02-26 DIAGNOSIS — E161 Other hypoglycemia: Secondary | ICD-10-CM | POA: Diagnosis not present

## 2022-02-26 DIAGNOSIS — R7303 Prediabetes: Secondary | ICD-10-CM | POA: Diagnosis not present

## 2022-02-26 DIAGNOSIS — R5383 Other fatigue: Secondary | ICD-10-CM | POA: Diagnosis not present

## 2022-02-26 DIAGNOSIS — E785 Hyperlipidemia, unspecified: Secondary | ICD-10-CM | POA: Diagnosis not present

## 2022-06-26 DIAGNOSIS — F419 Anxiety disorder, unspecified: Secondary | ICD-10-CM | POA: Diagnosis not present

## 2022-06-26 DIAGNOSIS — R69 Illness, unspecified: Secondary | ICD-10-CM | POA: Diagnosis not present

## 2022-06-26 DIAGNOSIS — E161 Other hypoglycemia: Secondary | ICD-10-CM | POA: Diagnosis not present

## 2022-08-08 DIAGNOSIS — Z7185 Encounter for immunization safety counseling: Secondary | ICD-10-CM | POA: Diagnosis not present

## 2022-08-08 DIAGNOSIS — M722 Plantar fascial fibromatosis: Secondary | ICD-10-CM | POA: Diagnosis not present

## 2022-08-08 DIAGNOSIS — R69 Illness, unspecified: Secondary | ICD-10-CM | POA: Diagnosis not present

## 2022-11-26 DIAGNOSIS — E161 Other hypoglycemia: Secondary | ICD-10-CM | POA: Diagnosis not present

## 2022-11-26 DIAGNOSIS — F411 Generalized anxiety disorder: Secondary | ICD-10-CM | POA: Diagnosis not present

## 2022-11-26 DIAGNOSIS — L2082 Flexural eczema: Secondary | ICD-10-CM | POA: Diagnosis not present

## 2022-11-26 DIAGNOSIS — G8929 Other chronic pain: Secondary | ICD-10-CM | POA: Diagnosis not present

## 2022-11-26 DIAGNOSIS — M5441 Lumbago with sciatica, right side: Secondary | ICD-10-CM | POA: Diagnosis not present

## 2023-01-28 DIAGNOSIS — E161 Other hypoglycemia: Secondary | ICD-10-CM | POA: Diagnosis not present

## 2023-01-28 DIAGNOSIS — R7303 Prediabetes: Secondary | ICD-10-CM | POA: Diagnosis not present

## 2023-01-28 DIAGNOSIS — R5383 Other fatigue: Secondary | ICD-10-CM | POA: Diagnosis not present

## 2023-01-28 DIAGNOSIS — E785 Hyperlipidemia, unspecified: Secondary | ICD-10-CM | POA: Diagnosis not present

## 2023-01-30 DIAGNOSIS — E161 Other hypoglycemia: Secondary | ICD-10-CM | POA: Diagnosis not present

## 2023-01-30 DIAGNOSIS — E291 Testicular hypofunction: Secondary | ICD-10-CM | POA: Diagnosis not present

## 2023-01-30 DIAGNOSIS — L309 Dermatitis, unspecified: Secondary | ICD-10-CM | POA: Diagnosis not present

## 2023-01-30 DIAGNOSIS — Z1389 Encounter for screening for other disorder: Secondary | ICD-10-CM | POA: Diagnosis not present

## 2023-01-30 DIAGNOSIS — L2082 Flexural eczema: Secondary | ICD-10-CM | POA: Diagnosis not present

## 2023-03-01 ENCOUNTER — Emergency Department (HOSPITAL_COMMUNITY)
Admission: EM | Admit: 2023-03-01 | Discharge: 2023-03-01 | Disposition: A | Discharge status: Home / Self Care | Payer: 59 | Attending: Student | Admitting: Student

## 2023-03-01 ENCOUNTER — Encounter (HOSPITAL_COMMUNITY): Payer: Self-pay | Admitting: *Deleted

## 2023-03-01 ENCOUNTER — Emergency Department (HOSPITAL_COMMUNITY): Payer: 59

## 2023-03-01 ENCOUNTER — Other Ambulatory Visit: Payer: Self-pay

## 2023-03-01 DIAGNOSIS — J3489 Other specified disorders of nose and nasal sinuses: Secondary | ICD-10-CM | POA: Diagnosis not present

## 2023-03-01 DIAGNOSIS — R9389 Abnormal findings on diagnostic imaging of other specified body structures: Secondary | ICD-10-CM | POA: Diagnosis not present

## 2023-03-01 DIAGNOSIS — Z720 Tobacco use: Secondary | ICD-10-CM | POA: Insufficient documentation

## 2023-03-01 DIAGNOSIS — R079 Chest pain, unspecified: Secondary | ICD-10-CM | POA: Diagnosis not present

## 2023-03-01 DIAGNOSIS — R0789 Other chest pain: Secondary | ICD-10-CM | POA: Diagnosis not present

## 2023-03-01 DIAGNOSIS — R918 Other nonspecific abnormal finding of lung field: Secondary | ICD-10-CM | POA: Diagnosis not present

## 2023-03-01 DIAGNOSIS — Z7982 Long term (current) use of aspirin: Secondary | ICD-10-CM | POA: Insufficient documentation

## 2023-03-01 DIAGNOSIS — R519 Headache, unspecified: Secondary | ICD-10-CM | POA: Diagnosis not present

## 2023-03-01 LAB — CBC
HCT: 45.6 % (ref 39.0–52.0)
Hemoglobin: 15.2 g/dL (ref 13.0–17.0)
MCH: 31.3 pg (ref 26.0–34.0)
MCHC: 33.3 g/dL (ref 30.0–36.0)
MCV: 93.8 fL (ref 80.0–100.0)
Platelets: 309 10*3/uL (ref 150–400)
RBC: 4.86 MIL/uL (ref 4.22–5.81)
RDW: 12.3 % (ref 11.5–15.5)
WBC: 5.9 10*3/uL (ref 4.0–10.5)
nRBC: 0 % (ref 0.0–0.2)

## 2023-03-01 LAB — BASIC METABOLIC PANEL
Anion gap: 8 (ref 5–15)
BUN: 15 mg/dL (ref 6–20)
CO2: 26 mmol/L (ref 22–32)
Calcium: 8.7 mg/dL — ABNORMAL LOW (ref 8.9–10.3)
Chloride: 102 mmol/L (ref 98–111)
Creatinine, Ser: 0.9 mg/dL (ref 0.61–1.24)
GFR, Estimated: >60 mL/min (ref >60)
Glucose, Bld: 96 mg/dL (ref 70–99)
Potassium: 4.3 mmol/L (ref 3.5–5.1)
Sodium: 136 mmol/L (ref 135–145)

## 2023-03-01 LAB — TROPONIN I (HIGH SENSITIVITY)
Troponin I (High Sensitivity): 4 ng/L
Troponin I (High Sensitivity): 4 ng/L (ref <18)

## 2023-03-01 MED ORDER — AMOXICILLIN-POT CLAVULANATE 875-125 MG PO TABS: 1.0000 | ORAL_TABLET | Freq: Two times a day (BID) | ORAL | 0 refills | Status: DC

## 2023-03-01 MED ORDER — METOCLOPRAMIDE HCL 5 MG/ML IJ SOLN
10.0000 mg | Freq: Once | INTRAMUSCULAR | Status: AC
  Administered 2023-03-01: 10 mg via INTRAVENOUS
  Filled 2023-03-01: qty 2

## 2023-03-01 MED ORDER — CYCLOBENZAPRINE HCL 10 MG PO TABS
5.0000 mg | ORAL_TABLET | Freq: Once | ORAL | Status: AC
  Administered 2023-03-01: 5 mg via ORAL
  Filled 2023-03-01: qty 1

## 2023-03-01 MED ORDER — DIPHENHYDRAMINE HCL 50 MG/ML IJ SOLN
25.0000 mg | Freq: Once | INTRAMUSCULAR | Status: AC
  Administered 2023-03-01: 25 mg via INTRAVENOUS
  Filled 2023-03-01: qty 1

## 2023-03-01 NOTE — Discharge Instructions (Signed)
Please follow-up with your primary care doctor, and to get further testing for your heart.  Additionally I believe that you may have had a headache, secondary to chronic migraine, I am glad you are feeling better.  If you have any numbness, tingling, difficulty speech please return to the ER.  Additionally we are going to treat you for a possible sinus infection, given ear swelling, redness of your face.  Please take the Augmentin as prescribed.  Return to the ER if you have severe chest pain, shortness of breath, or numbness or tingling on one side of your body.

## 2023-03-01 NOTE — ED Triage Notes (Signed)
C/o headache for several weeks with chest pain off and on, states the pain goes in to his back and he also has stabbing pain in his arms at times.

## 2023-03-01 NOTE — ED Provider Notes (Signed)
[place] EMERGENCY DEPARTMENT AT [facility] Provider Note   CSN: [id number] Arrival date & time: 03/01/23  0845     History  Chief Complaint  Patient presents with   Headache   Chest Pain    Colin Calhoun is a 32 y.o. male, history of tobacco use, chronic neck pain, who presents to the ED secondary to multiple complaints.  He complains of a severe headache on the right side of his head, with associated nausea, vomiting, photophobia, phonophobia, and head tenderness has been going on for the last 3 weeks.  He is taken ibuprofen, without relief.  States he has not had any head injury, to which he states is intractable.  States the pain is sharp and stabbing.   Additionally states that he is also having chest pain that has been intermittent for the last 3 weeks, sharp and stabbing stabbing, associated with some nausea, but no vomiting.  No shortness of breath.  Notes that the pain is along the left breast, and radiates to the back, and sometimes down left arm.  Denies any numbness or tingling however.  Has not taken anything for this.  Is no longer smoking.  Other complaint is that he has had some tenderness to his left sinus.  Denies any kind of increased nasal congestion, fever, chills, rash.  States it has been more swollen there.    Home Medications Prior to Admission medications   Medication Sig Start Date End Date Taking? Authorizing Provider  amoxicillin-clavulanate (AUGMENTIN) 875-125 MG tablet Take 1 tablet by mouth every 12 (twelve) hours. 03/01/23  Yes Xela Oregel L, PA  amphetamine-dextroamphetamine (ADDERALL) 20 MG tablet Take 20 mg by mouth daily. 02/05/21   [provider]  aspirin 325 MG EC tablet Take by mouth. 03/05/19   [provider]  clonazePAM (KLONOPIN) 1 MG tablet Take 0.5-1 mg by mouth daily as needed. 03/05/21   [provider]  gabapentin (NEURONTIN) 100 MG capsule Take 1 capsule (100 mg total) by mouth 2 (two) times  daily. 05/12/18   [name], FNP  metFORMIN (GLUCOPHAGE) 500 MG tablet Take 500 mg by mouth 2 (two) times daily. 03/12/21   [provider]      Allergies    Patient has no known allergies.    Review of Systems   Review of Systems  Constitutional:  Negative for fever.  Cardiovascular:  Positive for chest pain.  Neurological:  Positive for headaches.    Physical Exam Updated Vital Signs BP 119/79   Pulse (!) 49   Temp 98.5 F (36.9 C) (Oral)   Resp 15   Ht 6' (1.829 m)   Wt 98.9 kg   SpO2 100%   BMI 29.57 kg/m  Physical Exam Vitals and nursing note reviewed.  Constitutional:      General: He is not in acute distress.    Appearance: He is well-developed.  HENT:     Head: Normocephalic and atraumatic.     Mouth/Throat:     Comments: Tenderness to palpation of left maxillary sinus, with mild edema, and mild erythema Eyes:     Conjunctiva/sclera: Conjunctivae normal.  Neck:     Comments: +TTP of L paraspinal cervical muscles as well as L scapula and trapezius.  Cardiovascular:     Rate and Rhythm: Normal rate and regular rhythm.     Heart sounds: No murmur heard. Pulmonary:     Effort: Pulmonary effort is normal. No respiratory distress.     Breath  sounds: Normal breath sounds.  Abdominal:     Palpations: Abdomen is soft.     Tenderness: There is no abdominal tenderness.  Musculoskeletal:        General: No swelling.     Cervical back: Neck supple.  Skin:    General: Skin is warm and dry.     Capillary Refill: Capillary refill takes less than 2 seconds.  Neurological:     Mental Status: He is alert.  Psychiatric:        Mood and Affect: Mood normal.     ED Results / Procedures / Treatments   Labs (all labs ordered are listed, but only abnormal results are displayed) Labs Reviewed  BASIC METABOLIC PANEL - Abnormal; Notable for the following components:      Result Value   Calcium 8.7 (*)    All other components within normal limits  CBC   URINALYSIS, ROUTINE W REFLEX MICROSCOPIC  TROPONIN I (HIGH SENSITIVITY)  TROPONIN I (HIGH SENSITIVITY)    EKG None  Radiology CT Head Wo Contrast  Result Date: 03/01/2023 CLINICAL DATA:  32 year old male with sudden severe headache. EXAM: CT HEAD WITHOUT CONTRAST TECHNIQUE: Contiguous axial images were obtained from the base of the skull through the vertex without intravenous contrast. RADIATION DOSE REDUCTION: This exam was performed according to the departmental dose-optimization program which includes automated exposure control, adjustment of the mA and/or kV according to patient size and/or use of iterative reconstruction technique. COMPARISON:  None Available. FINDINGS: Brain: Normal cerebral volume. No midline shift, ventriculomegaly, mass effect, evidence of mass lesion, intracranial hemorrhage or evidence of cortically based acute infarction. Gray-white matter differentiation is within normal limits throughout the brain. Vascular: No suspicious intracranial vascular hyperdensity. Skull: Possible chronic bilateral lamina papyracea fractures on series 4, image 24. But No acute osseous abnormality identified. Otherwise negative. Sinuses/Orbits: Visualized paranasal sinuses and mastoids are well aerated. Other: Visualized orbits and scalp soft tissues are within normal limits. IMPRESSION: Negative, normal noncontrast CT appearance of the brain. Electronically Signed   By: Odessa Fleming M.D.   On: 03/01/2023 10:44   DG Chest 2 View  Result Date: 03/01/2023 CLINICAL DATA:  32 year old male with history of headache and chest pain. EXAM: CHEST - 2 VIEW COMPARISON:  Chest x-ray 06/02/2018. FINDINGS: Chronic elevation of the right hemidiaphragm with mild scarring in the periphery of the right lung base, similar to the prior study. No acute consolidative airspace disease. No pleural effusions. No pneumothorax. No evidence of pulmonary edema. Heart size is normal. Upper mediastinal contours are within normal  limits. IMPRESSION: 1. No radiographic evidence of acute cardiopulmonary disease. Chronic elevation of the right hemidiaphragm and apparent scarring in the periphery of the right lung base, stable compared to prior study from 2019. Electronically Signed   By: Trudie Reed M.D.   On: 03/01/2023 10:06    Procedures Procedures    Medications Ordered in ED Medications  cyclobenzaprine (FLEXERIL) tablet 5 mg (5 mg Oral Given 03/01/23 1004)  metoCLOPramide (REGLAN) injection 10 mg (10 mg Intravenous Given 03/01/23 1006)  diphenhydrAMINE (BENADRYL) injection 25 mg (25 mg Intravenous Given 03/01/23 1005)    ED Course/ Medical Decision Making/ A&P             HEART Score: 3                Medical Decision Making Patient is a 32 year old male with here with multiple complaints, 1 being some facial pain, he does have tenderness to palpation of  the left maxillary sinus, with mild erythema, edema this may be brewing cellulitis, versus sinusitis, we will treat it with Augmentin.  Additionally will he is here for chest pain, that radiates down his left arm, he has a good blood pressure, does not to be appear to be in any distress, no neurologic symptoms, and we will obtain troponins, chest x-ray, he is PERC negative.  Also complains of a headache, that is been going on for the last couple weeks, he has no neurodeficits but given the persistent headache, we will obtain a CT of his head, for further evaluation.  Amount and/or Complexity of Data Reviewed Labs: ordered.    Details: Troponins x 2 within normal limits Radiology: ordered.    Details: CT head, within normal limits chest x-ray shows no acute findings Discussion of management or test interpretation with external provider(s): Patient with heart score 3, he will follow-up with his primary care in regards to this.  He states that chest pain is gone, now, and he has no more back pain, now after the Flexeril, Toradol.  Also states his headache is  resolved, but he just feels sleepy after the Benadryl.  Is head CT was unremarkable.  I believe he may have had a chronic migraine, that needed to be treated as well as possibly musculoskeletal/cervicogenic headache, secondary to his neck pain, and chronic back pain.  The chest pain may be related to his muscle tension as well.  He will need to follow-up with his primary care doctor for further evaluation however.  He is agreeable to this.  Additionally given the swelling, tenderness to the left sinus we will treated for a sinus infection, versus developing facial cellulitis with Augmentin.  Return precautions emphasized.  Risk Prescription drug management.    Final Clinical Impression(s) / ED Diagnoses Final diagnoses:  Sinus pain  Chest pain, unspecified type  Acute nonintractable headache, unspecified headache type    Rx / DC Orders ED Discharge Orders          Ordered    amoxicillin-clavulanate (AUGMENTIN) 875-125 MG tablet  Every 12 hours        03/01/23 1228              Pete Pelt, PA 03/01/23 1250    Glendora Score, MD 03/01/23 [id number]

## 2023-03-03 ENCOUNTER — Ambulatory Visit: Payer: Self-pay

## 2023-03-04 DIAGNOSIS — R5381 Other malaise: Secondary | ICD-10-CM | POA: Diagnosis not present

## 2023-03-04 DIAGNOSIS — T675XXD Heat exhaustion, unspecified, subsequent encounter: Secondary | ICD-10-CM | POA: Diagnosis not present

## 2023-03-04 DIAGNOSIS — Z6829 Body mass index (BMI) 29.0-29.9, adult: Secondary | ICD-10-CM | POA: Diagnosis not present

## 2023-03-04 DIAGNOSIS — R5383 Other fatigue: Secondary | ICD-10-CM | POA: Diagnosis not present

## 2023-05-21 ENCOUNTER — Ambulatory Visit (HOSPITAL_BASED_OUTPATIENT_CLINIC_OR_DEPARTMENT_OTHER): Payer: 59 | Admitting: Family Medicine

## 2023-07-03 DIAGNOSIS — E119 Type 2 diabetes mellitus without complications: Secondary | ICD-10-CM | POA: Diagnosis not present

## 2023-07-03 DIAGNOSIS — M5412 Radiculopathy, cervical region: Secondary | ICD-10-CM | POA: Diagnosis not present

## 2023-07-03 DIAGNOSIS — F419 Anxiety disorder, unspecified: Secondary | ICD-10-CM | POA: Diagnosis not present

## 2023-07-03 DIAGNOSIS — F900 Attention-deficit hyperactivity disorder, predominantly inattentive type: Secondary | ICD-10-CM | POA: Diagnosis not present

## 2023-07-10 ENCOUNTER — Encounter (HOSPITAL_BASED_OUTPATIENT_CLINIC_OR_DEPARTMENT_OTHER): Payer: Self-pay | Admitting: Family Medicine

## 2023-08-04 ENCOUNTER — Other Ambulatory Visit: Payer: Self-pay

## 2023-08-04 ENCOUNTER — Emergency Department (HOSPITAL_COMMUNITY)
Admission: EM | Admit: 2023-08-04 | Discharge: 2023-08-04 | Disposition: A | Discharge status: Home / Self Care | Payer: 59 | Attending: Emergency Medicine | Admitting: Emergency Medicine

## 2023-08-04 ENCOUNTER — Encounter (HOSPITAL_COMMUNITY): Payer: Self-pay | Admitting: Emergency Medicine

## 2023-08-04 ENCOUNTER — Emergency Department (HOSPITAL_COMMUNITY): Payer: 59

## 2023-08-04 DIAGNOSIS — S6991XA Unspecified injury of right wrist, hand and finger(s), initial encounter: Secondary | ICD-10-CM | POA: Diagnosis not present

## 2023-08-04 DIAGNOSIS — S60021A Contusion of right index finger without damage to nail, initial encounter: Secondary | ICD-10-CM | POA: Insufficient documentation

## 2023-08-04 DIAGNOSIS — I10 Essential (primary) hypertension: Secondary | ICD-10-CM | POA: Diagnosis not present

## 2023-08-04 DIAGNOSIS — Z7984 Long term (current) use of oral hypoglycemic drugs: Secondary | ICD-10-CM | POA: Diagnosis not present

## 2023-08-04 DIAGNOSIS — F17200 Nicotine dependence, unspecified, uncomplicated: Secondary | ICD-10-CM | POA: Insufficient documentation

## 2023-08-04 DIAGNOSIS — W228XXA Striking against or struck by other objects, initial encounter: Secondary | ICD-10-CM | POA: Diagnosis not present

## 2023-08-04 DIAGNOSIS — M79644 Pain in right finger(s): Secondary | ICD-10-CM | POA: Diagnosis not present

## 2023-08-04 MED ORDER — HYDROCODONE-ACETAMINOPHEN 5-325 MG PO TABS
1.0000 | ORAL_TABLET | Freq: Once | ORAL | Status: AC
  Administered 2023-08-04: 1 via ORAL
  Filled 2023-08-04: qty 1

## 2023-08-04 MED ORDER — ACETAMINOPHEN 500 MG PO TABS: 1000.0000 mg | ORAL_TABLET | Freq: Once | ORAL | Status: DC

## 2023-08-04 NOTE — ED Provider Notes (Signed)
[name] Provider Note   CSN: [id number] Arrival date & time: 08/04/23  4098     History  Chief Complaint  Patient presents with   Finger Injury    F…Colin Calhoun is a 32 y.o. male patient with past medical history of hypertension reports to emergency room after slamming the door on his right index finger.  Patient reports this happened yesterday.  It is worse over the distal aspect of his index finger and caused bruising under his nail.  Patient reports he is able to move his hand.  He reports pain and swelling.  Patient has no prior surgeries on this finger before.  Denies any other associated symptoms.  HPI     Home Medications Prior to Admission medications   Medication Sig Start Date End Date Taking? Authorizing Provider  amoxicillin-clavulanate (AUGMENTIN) 875-125 MG tablet Take 1 tablet by mouth every 12 (twelve) hours. 03/01/23   Calhoun, Colin L, PA  amphetamine-dextroamphetamine (ADDERALL) 20 MG tablet Take 20 mg by mouth daily. 02/05/21   [provider]  aspirin 325 MG EC tablet Take by mouth. 03/05/19   [provider]  clonazePAM (KLONOPIN) 1 MG tablet Take 0.5-1 mg by mouth daily as needed. 03/05/21   [provider]  gabapentin (NEURONTIN) 100 MG capsule Take 1 capsule (100 mg total) by mouth 2 (two) times daily. 05/12/18   [name], FNP  metFORMIN (GLUCOPHAGE) 500 MG tablet Take 500 mg by mouth 2 (two) times daily. 03/12/21   [provider]      Allergies    Patient has no known allergies.    Review of Systems   Review of Systems  Musculoskeletal:  Positive for arthralgias.    Physical Exam Updated Vital Signs BP 129/89 (BP Location: Right Arm)   Pulse 75   Resp 16   SpO2 96%  Physical Exam  ED Results / Procedures / Treatments   Labs (all labs ordered are listed, but only abnormal results are displayed) Labs Reviewed - No data to  display  EKG None  Radiology DG Hand Complete Right Result Date: 08/04/2023 CLINICAL DATA:  Right index finger pain status post injury. EXAM: RIGHT HAND - COMPLETE 3+ VIEW COMPARISON:  None Available. FINDINGS: There is no evidence of fracture or dislocation. There is no evidence of arthropathy or other focal bone abnormality. Soft tissues are unremarkable. IMPRESSION: Negative. Electronically Signed   By: Signa Kell M.D.   On: 08/04/2023 08:52    Procedures Procedures    Medications Ordered in ED Medications  acetaminophen (TYLENOL) tablet 1,000 mg (has no administration in time range)    ED Course/ Medical Decision Making/ A&P                                 Medical Decision Making Amount and/or Complexity of Data Reviewed Radiology: ordered.  Risk OTC drugs. Prescription drug management.   Calhoun Colin 32 y.o. presented today for right finger pain.  Differentials include sprain, strain, fracture, dislocation, subluxation, contusion, laceration, gout, septic joint.  R/o DDx: These diagnoses are less consistent than current impression due to findings on history of present illness, physical exam, labs/imaging findings.  Review of prior external notes: None  Pmhx: Hypertension, patient is a smoker  Unique Tests and My Interpretation:  Labs were not ordered  Imaging:  Imaging of right hand  Problem List / ED Course /  Critical interventions / Medication management  Patient reporting to emergency room after injury to right index finger that occurred yesterday.  Patient reports that he slammed his finger in a door since then he has noticed pain and swelling over the area.  Patient reports pain is worse at the distal aspect but he has been able to move his hand.  Has no decreased sensation to the distal finger, good cap refill.  Strong radial pulse equal bilaterally. Patient's x-ray without acute fracture or dislocation.  Patient's pain has improved.  Patient  requesting splint to protect his finger while he is at work.  Splint will be applied.  Patient okay to follow-up with primary care closely.  Return to emergency room with new or worsening symptoms. I ordered medication including Norco  Reevaluation of the patient after these medicines showed that the patient improved Patients vitals assessed. Upon arrival patient is hemodynamically stable.  I have reviewed the patients home medicines and have made adjustments as needed    Consult: None  Plan:  Alternate Tylenol and ibuprofen for pain control.  Ice and elevation.  Patient provided with splint for comfort.  Follow-up with primary care.  Stable for discharge.          Final Clinical Impression(s) / ED Diagnoses Final diagnoses:  Contusion of right index finger without damage to nail, initial encounter    Rx / DC Orders ED Discharge Orders     None         Colin Knudsen, PA-C 08/04/23 4098    Colin Manchester, MD 08/04/23 1620

## 2023-08-04 NOTE — Discharge Instructions (Addendum)
You were seen in the emergency room today for finger injury.  Please alternate Tylenol and ibuprofen for pain control.  Please follow-up with primary care to ensure resolution of symptoms.  In the meantime he can use ice elevate your finger. Wear brace as needed for comfort.

## 2023-08-04 NOTE — ED Triage Notes (Addendum)
Pt reports hutting his right pointer finger in a door. Bruised nailbed.

## 2023-10-15 ENCOUNTER — Emergency Department (HOSPITAL_COMMUNITY)
Admission: EM | Admit: 2023-10-15 | Discharge: 2023-10-15 | Disposition: A | Discharge status: Home / Self Care | Payer: 59 | Attending: Emergency Medicine | Admitting: Emergency Medicine

## 2023-10-15 ENCOUNTER — Other Ambulatory Visit: Payer: Self-pay

## 2023-10-15 ENCOUNTER — Emergency Department (HOSPITAL_COMMUNITY): Payer: 59

## 2023-10-15 DIAGNOSIS — Z7984 Long term (current) use of oral hypoglycemic drugs: Secondary | ICD-10-CM | POA: Diagnosis not present

## 2023-10-15 DIAGNOSIS — I10 Essential (primary) hypertension: Secondary | ICD-10-CM | POA: Diagnosis not present

## 2023-10-15 DIAGNOSIS — E161 Other hypoglycemia: Secondary | ICD-10-CM | POA: Diagnosis not present

## 2023-10-15 DIAGNOSIS — Z743 Need for continuous supervision: Secondary | ICD-10-CM | POA: Diagnosis not present

## 2023-10-15 DIAGNOSIS — E162 Hypoglycemia, unspecified: Secondary | ICD-10-CM | POA: Diagnosis not present

## 2023-10-15 DIAGNOSIS — H0530 Unspecified deformity of orbit: Secondary | ICD-10-CM | POA: Diagnosis not present

## 2023-10-15 DIAGNOSIS — R4182 Altered mental status, unspecified: Secondary | ICD-10-CM

## 2023-10-15 DIAGNOSIS — R4781 Slurred speech: Secondary | ICD-10-CM | POA: Diagnosis not present

## 2023-10-15 LAB — RAPID URINE DRUG SCREEN, HOSP PERFORMED
Amphetamines: NOT DETECTED
Barbiturates: NOT DETECTED
Benzodiazepines: POSITIVE — AB
Cocaine: NOT DETECTED
Opiates: NOT DETECTED
Tetrahydrocannabinol: POSITIVE — AB

## 2023-10-15 LAB — CBC WITH DIFFERENTIAL/PLATELET
Abs Immature Granulocytes: 0.02 10*3/uL (ref 0.00–0.07)
Basophils Absolute: 0 10*3/uL (ref 0.0–0.1)
Basophils Relative: 1 %
Eosinophils Absolute: 0.2 10*3/uL (ref 0.0–0.5)
Eosinophils Relative: 2 %
HCT: 43.3 % (ref 39.0–52.0)
Hemoglobin: 15 g/dL (ref 13.0–17.0)
Immature Granulocytes: 0 %
Lymphocytes Relative: 23 %
Lymphs Abs: 1.6 10*3/uL (ref 0.7–4.0)
MCH: 31.5 pg (ref 26.0–34.0)
MCHC: 34.6 g/dL (ref 30.0–36.0)
MCV: 91 fL (ref 80.0–100.0)
Monocytes Absolute: 0.6 10*3/uL (ref 0.1–1.0)
Monocytes Relative: 9 %
Neutro Abs: 4.3 10*3/uL (ref 1.7–7.7)
Neutrophils Relative %: 65 %
Platelets: 318 10*3/uL (ref 150–400)
RBC: 4.76 MIL/uL (ref 4.22–5.81)
RDW: 12 % (ref 11.5–15.5)
WBC: 6.7 10*3/uL (ref 4.0–10.5)
nRBC: 0 % (ref 0.0–0.2)

## 2023-10-15 LAB — COMPREHENSIVE METABOLIC PANEL
ALT: 22 U/L (ref 0–44)
AST: 25 U/L (ref 15–41)
Albumin: 4.5 g/dL (ref 3.5–5.0)
Alkaline Phosphatase: 44 U/L (ref 38–126)
Anion gap: 12 (ref 5–15)
BUN: 10 mg/dL (ref 6–20)
CO2: 22 mmol/L (ref 22–32)
Calcium: 9.3 mg/dL (ref 8.9–10.3)
Chloride: 104 mmol/L (ref 98–111)
Creatinine, Ser: 0.9 mg/dL (ref 0.61–1.24)
GFR, Estimated: >60 mL/min (ref >60)
Glucose, Bld: 107 mg/dL — ABNORMAL HIGH (ref 70–99)
Potassium: 3.3 mmol/L — ABNORMAL LOW (ref 3.5–5.1)
Sodium: 138 mmol/L (ref 135–145)
Total Bilirubin: 1.1 mg/dL (ref 0.0–1.2)
Total Protein: 7.4 g/dL (ref 6.5–8.1)

## 2023-10-15 LAB — AMMONIA: Ammonia: 31 umol/L (ref 9–35)

## 2023-10-15 LAB — URINALYSIS, ROUTINE W REFLEX MICROSCOPIC
Bilirubin Urine: NEGATIVE
Glucose, UA: NEGATIVE mg/dL
Hgb urine dipstick: NEGATIVE
Ketones, ur: NEGATIVE mg/dL
Leukocytes,Ua: NEGATIVE
Nitrite: NEGATIVE
Protein, ur: NEGATIVE mg/dL
Specific Gravity, Urine: 1.009 (ref 1.005–1.030)
pH: 6 (ref 5.0–8.0)

## 2023-10-15 LAB — CBG MONITORING, ED: Glucose-Capillary: 105 mg/dL — ABNORMAL HIGH (ref 70–99)

## 2023-10-15 LAB — ETHANOL: Alcohol, Ethyl (B): <10 mg/dL (ref <10)

## 2023-10-15 NOTE — ED Triage Notes (Signed)
 Ems was called for hypoglycemia. Blood sugar 65 on ems arrival, given apple juice and snack, up to 95. Patient still wanted to come in. States he feels altered, nausea, clumsy. States this happens once a month for years and has gotten worse over time. Patient appears to be slurring his words, forgetful, and appearing intoxicated. Denies any alcohol or drug use. Patient states he's never been diagnosed for diabetes. A+Ox4.  Pt hypertensive with ems at 150s/110s.

## 2023-10-15 NOTE — ED Notes (Addendum)
 Patient discharged. Provider spoke to patient and wife. Paperwork given to patient and reviewed. wife verbalized understanding, pt drowsy. VSS. A+Ox4. Patient wheeled out to car, steady gait on transfer. IV removed intact without complications.

## 2023-10-15 NOTE — ED Provider Notes (Signed)
 [place] EMERGENCY DEPARTMENT AT [facility] Provider Note   CSN: [id number] Arrival date & time: 10/15/23  1919     History {Add pertinent medical, surgical, social history, OB history to HPI:1} Chief Complaint  Patient presents with   Hypoglycemia    Colin Calhoun is a 33 y.o. male.  He is brought in by EMS for confusion.  Initial call was for low blood sugar, EMS found his blood sugar to be 65 and gave him oral juice and a snack.  He said he has had episodes of low blood sugar before but not a diabetic.  Significant other said he has been altered since around 10 AM this morning.  More forgetful and alteration in his speech.  He said this has happened 1 prior time in the past.  He has no headache numbness or weakness.  He did take a gummy this morning and sometimes uses gabapentin for pain and stiffness.  He denies any alcohol or drug use.  The history is provided by the patient, the spouse and the EMS personnel.  Altered Mental Status Presenting symptoms: behavior changes and confusion   Most recent episode:  Today Duration:  10 hours Timing:  Constant Progression:  Unchanged Chronicity:  New Associated symptoms: slurred speech   Associated symptoms: no abdominal pain, no difficulty breathing, no fever, no headaches, no nausea and no vomiting        Home Medications Prior to Admission medications   Medication Sig Start Date End Date Taking? Authorizing Provider  amoxicillin-clavulanate (AUGMENTIN) 875-125 MG tablet Take 1 tablet by mouth every 12 (twelve) hours. 03/01/23   Small, [name], PA  amphetamine-dextroamphetamine (ADDERALL) 20 MG tablet Take 20 mg by mouth daily. 02/05/21   [provider]  aspirin 325 MG EC tablet Take by mouth. 03/05/19   [provider]  clonazePAM (KLONOPIN) 1 MG tablet Take 0.5-1 mg by mouth daily as needed. 03/05/21   [provider]  gabapentin (NEURONTIN) 100 MG capsule Take 1 capsule (100 mg total) by  mouth 2 (two) times daily. 05/12/18   [name], FNP  metFORMIN (GLUCOPHAGE) 500 MG tablet Take 500 mg by mouth 2 (two) times daily. 03/12/21   [provider]      Allergies    Patient has no known allergies.    Review of Systems   Review of Systems  Constitutional:  Negative for fever.  Eyes:  Negative for pain.  Respiratory:  Negative for shortness of breath.   Cardiovascular:  Negative for chest pain.  Gastrointestinal:  Negative for abdominal pain, nausea and vomiting.  Genitourinary:  Negative for dysuria.  Neurological:  Negative for headaches.  Psychiatric/Behavioral:  Positive for confusion.     Physical Exam Updated Vital Signs BP (!) 138/96 (BP Location: Left Arm)   Pulse 86   Temp 98.3 F (36.8 C) (Oral)   Resp 20   SpO2 97%  Physical Exam Vitals and nursing note reviewed.  Constitutional:      General: He is not in acute distress.    Appearance: Normal appearance. He is well-developed.  HENT:     Head: Normocephalic and atraumatic.  Eyes:     Conjunctiva/sclera: Conjunctivae normal.  Cardiovascular:     Rate and Rhythm: Normal rate and regular rhythm.     Heart sounds: No murmur heard. Pulmonary:     Effort: Pulmonary effort is normal. No respiratory distress.     Breath sounds: Normal breath sounds.  Abdominal:  Palpations: Abdomen is soft.     Tenderness: There is no abdominal tenderness. There is no guarding or rebound.  Musculoskeletal:        General: No deformity.     Cervical back: Neck supple.  Skin:    General: Skin is warm and dry.     Capillary Refill: Capillary refill takes less than 2 seconds.  Neurological:     General: No focal deficit present.     Mental Status: He is alert and oriented to person, place, and time.     Cranial Nerves: No cranial nerve deficit.     Sensory: No sensory deficit.     Motor: No weakness.     Comments: He has slight slurring of his words.  His thought content seems intact though.  No facial  droop no sensory or motor deficits otherwise.     ED Results / Procedures / Treatments   Labs (all labs ordered are listed, but only abnormal results are displayed) Labs Reviewed  CBG MONITORING, ED - Abnormal; Notable for the following components:      Result Value   Glucose-Capillary 105 (*)    All other components within normal limits    EKG None  Radiology No results found.  Procedures Procedures  {Document cardiac monitor, telemetry assessment procedure when appropriate:1}  Medications Ordered in ED Medications - No data to display  ED Course/ Medical Decision Making/ A&P   {   Click here for ABCD2, HEART and other calculatorsREFRESH Note before signing :1}                              Medical Decision Making Amount and/or Complexity of Data Reviewed Labs: ordered. Radiology: ordered.   This patient complains of ***; this involves an extensive number of treatment Options and is a complaint that carries with it a high risk of complications and morbidity. The differential includes ***  I ordered, reviewed and interpreted labs, which included *** I ordered medication *** and reviewed PMP when indicated. I ordered imaging studies which included *** and I independently    visualized and interpreted imaging which showed *** Additional history obtained from *** Previous records obtained and reviewed *** I consulted *** and discussed lab and imaging findings and discussed disposition.  Cardiac monitoring reviewed, *** Social determinants considered, *** Critical Interventions: ***  After the interventions stated above, I reevaluated the patient and found *** Admission and further testing considered, ***   {Document critical care time when appropriate:1} {Document review of labs and clinical decision tools ie heart score, Chads2Vasc2 etc:1}  {Document your independent review of radiology images, and any outside records:1} {Document your discussion with family  members, caretakers, and with consultants:1} {Document social determinants of health affecting pt's care:1} {Document your decision making why or why not admission, treatments were needed:1} Final Clinical Impression(s) / ED Diagnoses Final diagnoses:  None    Rx / DC Orders ED Discharge Orders     None

## 2023-10-15 NOTE — Discharge Instructions (Signed)
 You were seen in the emergency department for being very sleepy and altered and having slurred speech.  You had a CAT scan of your head and lab work.  The only significant finding was that you were positive for having benzodiazepines and THC in your system.  This may all be related to these chemicals.  Please rest drink plenty of fluids and avoid benzodiazepines and THC.  Follow-up with your regular doctor.  Return to the emergency department if any worsening or concerning symptoms

## 2023-10-17 ENCOUNTER — Emergency Department (HOSPITAL_BASED_OUTPATIENT_CLINIC_OR_DEPARTMENT_OTHER): Payer: 59

## 2023-10-17 ENCOUNTER — Emergency Department (HOSPITAL_BASED_OUTPATIENT_CLINIC_OR_DEPARTMENT_OTHER)
Admission: EM | Admit: 2023-10-17 | Discharge: 2023-10-17 | Disposition: A | Discharge status: Home / Self Care | Payer: 59 | Attending: Emergency Medicine | Admitting: Emergency Medicine

## 2023-10-17 ENCOUNTER — Encounter (HOSPITAL_BASED_OUTPATIENT_CLINIC_OR_DEPARTMENT_OTHER): Payer: Self-pay | Admitting: Emergency Medicine

## 2023-10-17 ENCOUNTER — Other Ambulatory Visit: Payer: Self-pay

## 2023-10-17 DIAGNOSIS — R4781 Slurred speech: Secondary | ICD-10-CM | POA: Insufficient documentation

## 2023-10-17 DIAGNOSIS — M25552 Pain in left hip: Secondary | ICD-10-CM | POA: Diagnosis not present

## 2023-10-17 DIAGNOSIS — Z7982 Long term (current) use of aspirin: Secondary | ICD-10-CM | POA: Diagnosis not present

## 2023-10-17 DIAGNOSIS — W228XXA Striking against or struck by other objects, initial encounter: Secondary | ICD-10-CM | POA: Diagnosis not present

## 2023-10-17 DIAGNOSIS — M25551 Pain in right hip: Secondary | ICD-10-CM | POA: Insufficient documentation

## 2023-10-17 DIAGNOSIS — Z96643 Presence of artificial hip joint, bilateral: Secondary | ICD-10-CM | POA: Insufficient documentation

## 2023-10-17 DIAGNOSIS — I10 Essential (primary) hypertension: Secondary | ICD-10-CM | POA: Diagnosis not present

## 2023-10-17 LAB — ETHANOL: Alcohol, Ethyl (B): <10 mg/dL (ref <10)

## 2023-10-17 LAB — COMPREHENSIVE METABOLIC PANEL
ALT: 18 U/L (ref 0–44)
AST: 21 U/L (ref 15–41)
Albumin: 4.8 g/dL (ref 3.5–5.0)
Alkaline Phosphatase: 47 U/L (ref 38–126)
Anion gap: 9 (ref 5–15)
BUN: 12 mg/dL (ref 6–20)
CO2: 24 mmol/L (ref 22–32)
Calcium: 9.7 mg/dL (ref 8.9–10.3)
Chloride: 104 mmol/L (ref 98–111)
Creatinine, Ser: 0.88 mg/dL (ref 0.61–1.24)
GFR, Estimated: >60 mL/min (ref >60)
Glucose, Bld: 102 mg/dL — ABNORMAL HIGH (ref 70–99)
Potassium: 3.9 mmol/L (ref 3.5–5.1)
Sodium: 137 mmol/L (ref 135–145)
Total Bilirubin: 1.2 mg/dL (ref 0.0–1.2)
Total Protein: 7.5 g/dL (ref 6.5–8.1)

## 2023-10-17 LAB — CBC
HCT: 44.1 % (ref 39.0–52.0)
Hemoglobin: 15.4 g/dL (ref 13.0–17.0)
MCH: 31.8 pg (ref 26.0–34.0)
MCHC: 34.9 g/dL (ref 30.0–36.0)
MCV: 90.9 fL (ref 80.0–100.0)
Platelets: 321 10*3/uL (ref 150–400)
RBC: 4.85 MIL/uL (ref 4.22–5.81)
RDW: 12 % (ref 11.5–15.5)
WBC: 7.1 10*3/uL (ref 4.0–10.5)
nRBC: 0 % (ref 0.0–0.2)

## 2023-10-17 LAB — CBG MONITORING, ED: Glucose-Capillary: 99 mg/dL (ref 70–99)

## 2023-10-17 NOTE — ED Triage Notes (Signed)
 Family states rash on neck and arms. Small cluster of red bumps.
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
## 2023-10-17 NOTE — ED Triage Notes (Signed)
 C/o AMS and balance issues x 3 days. States he has had multiple falls over the last couple days. Recently seen at [facility] for similar symptoms. No unilateral deficits. Patient appears intoxicated, Denies alcohol or drug use.
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
## 2023-11-05 ENCOUNTER — Ambulatory Visit: Payer: 59 | Admitting: Physician Assistant

## 2023-12-17 DIAGNOSIS — E119 Type 2 diabetes mellitus without complications: Secondary | ICD-10-CM | POA: Diagnosis not present

## 2023-12-17 DIAGNOSIS — Z113 Encounter for screening for infections with a predominantly sexual mode of transmission: Secondary | ICD-10-CM | POA: Diagnosis not present

## 2023-12-17 DIAGNOSIS — Z114 Encounter for screening for human immunodeficiency virus [HIV]: Secondary | ICD-10-CM | POA: Diagnosis not present

## 2023-12-17 DIAGNOSIS — F17210 Nicotine dependence, cigarettes, uncomplicated: Secondary | ICD-10-CM | POA: Diagnosis not present

## 2023-12-17 DIAGNOSIS — R634 Abnormal weight loss: Secondary | ICD-10-CM | POA: Diagnosis not present

## 2023-12-17 DIAGNOSIS — Z1159 Encounter for screening for other viral diseases: Secondary | ICD-10-CM | POA: Diagnosis not present

## 2024-03-01 DIAGNOSIS — G2581 Restless legs syndrome: Secondary | ICD-10-CM | POA: Diagnosis not present

## 2024-03-01 DIAGNOSIS — R61 Generalized hyperhidrosis: Secondary | ICD-10-CM | POA: Diagnosis not present

## 2024-07-21 DIAGNOSIS — R61 Generalized hyperhidrosis: Secondary | ICD-10-CM | POA: Diagnosis not present

## 2024-07-21 DIAGNOSIS — R5383 Other fatigue: Secondary | ICD-10-CM | POA: Diagnosis not present

## 2024-07-21 DIAGNOSIS — G2581 Restless legs syndrome: Secondary | ICD-10-CM | POA: Diagnosis not present

## 2024-07-21 DIAGNOSIS — G8929 Other chronic pain: Secondary | ICD-10-CM | POA: Diagnosis not present

## 2024-07-21 DIAGNOSIS — M5441 Lumbago with sciatica, right side: Secondary | ICD-10-CM | POA: Diagnosis not present
